# Patient Record
Sex: Male | Born: 1960 | Race: Black or African American | Hispanic: No | Marital: Married | Smoking: Never smoker
Health system: Southern US, Community
[De-identification: ages and names within clinical notes are randomized; demographics above are authoritative.]

## PROBLEM LIST (undated history)

## (undated) DIAGNOSIS — U071 COVID-19: Secondary | ICD-10-CM

---

## 2017-12-21 ENCOUNTER — Ambulatory Visit: Payer: Self-pay | Admitting: Family Medicine

## 2019-04-20 ENCOUNTER — Emergency Department (HOSPITAL_COMMUNITY): Payer: BLUE CROSS/BLUE SHIELD

## 2019-04-20 ENCOUNTER — Other Ambulatory Visit: Payer: Self-pay

## 2019-04-20 ENCOUNTER — Encounter (HOSPITAL_COMMUNITY): Payer: Self-pay | Admitting: Emergency Medicine

## 2019-04-20 ENCOUNTER — Emergency Department (HOSPITAL_COMMUNITY)
Admission: EM | Admit: 2019-04-20 | Discharge: 2019-04-20 | Disposition: A | Discharge status: Home / Self Care | Payer: BLUE CROSS/BLUE SHIELD | Attending: Emergency Medicine | Admitting: Emergency Medicine

## 2019-04-20 DIAGNOSIS — S060X0A Concussion without loss of consciousness, initial encounter: Secondary | ICD-10-CM

## 2019-04-20 DIAGNOSIS — Y999 Unspecified external cause status: Secondary | ICD-10-CM | POA: Insufficient documentation

## 2019-04-20 DIAGNOSIS — Y92410 Unspecified street and highway as the place of occurrence of the external cause: Secondary | ICD-10-CM | POA: Diagnosis not present

## 2019-04-20 DIAGNOSIS — R109 Unspecified abdominal pain: Secondary | ICD-10-CM | POA: Insufficient documentation

## 2019-04-20 DIAGNOSIS — Y9389 Activity, other specified: Secondary | ICD-10-CM | POA: Diagnosis not present

## 2019-04-20 DIAGNOSIS — R0789 Other chest pain: Secondary | ICD-10-CM | POA: Insufficient documentation

## 2019-04-20 DIAGNOSIS — R51 Headache: Secondary | ICD-10-CM | POA: Diagnosis present

## 2019-04-20 LAB — COMPREHENSIVE METABOLIC PANEL
ALT: 21 U/L (ref 0–44)
AST: 22 U/L (ref 15–41)
Albumin: 4.1 g/dL (ref 3.5–5.0)
Alkaline Phosphatase: 58 U/L (ref 38–126)
Anion gap: 7 (ref 5–15)
BUN: 8 mg/dL (ref 6–20)
CO2: 27 mmol/L (ref 22–32)
Calcium: 9 mg/dL (ref 8.9–10.3)
Chloride: 106 mmol/L (ref 98–111)
Creatinine, Ser: 1.04 mg/dL (ref 0.61–1.24)
GFR calc Af Amer: >60 mL/min (ref >60)
GFR calc non Af Amer: >60 mL/min (ref >60)
Glucose, Bld: 113 mg/dL — ABNORMAL HIGH (ref 70–99)
Potassium: 4.3 mmol/L (ref 3.5–5.1)
Sodium: 140 mmol/L (ref 135–145)
Total Bilirubin: 0.5 mg/dL (ref 0.3–1.2)
Total Protein: 6.5 g/dL (ref 6.5–8.1)

## 2019-04-20 LAB — CBC
HCT: 40.8 % (ref 39.0–52.0)
Hemoglobin: 13.9 g/dL (ref 13.0–17.0)
MCH: 30.7 pg (ref 26.0–34.0)
MCHC: 34.1 g/dL (ref 30.0–36.0)
MCV: 90.1 fL (ref 80.0–100.0)
Platelets: 203 10*3/uL (ref 150–400)
RBC: 4.53 MIL/uL (ref 4.22–5.81)
RDW: 11.9 % (ref 11.5–15.5)
WBC: 4 10*3/uL (ref 4.0–10.5)
nRBC: 0 % (ref 0.0–0.2)

## 2019-04-20 LAB — URINALYSIS, ROUTINE W REFLEX MICROSCOPIC
Bilirubin Urine: NEGATIVE
Glucose, UA: NEGATIVE mg/dL
Hgb urine dipstick: NEGATIVE
Ketones, ur: NEGATIVE mg/dL
Leukocytes,Ua: NEGATIVE
Nitrite: NEGATIVE
Protein, ur: NEGATIVE mg/dL
Specific Gravity, Urine: 1.012 (ref 1.005–1.030)
pH: 7 (ref 5.0–8.0)

## 2019-04-20 LAB — LIPASE, BLOOD: Lipase: 33 U/L (ref 11–51)

## 2019-04-20 MED ORDER — ONDANSETRON 4 MG PO TBDP: 4.0000 mg | ORAL_TABLET | Freq: Three times a day (TID) | ORAL | 0 refills | Status: DC | PRN

## 2019-04-20 MED ORDER — SODIUM CHLORIDE 0.9% FLUSH: 3.0000 mL | Freq: Once | INTRAVENOUS | Status: DC
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
## 2019-04-20 NOTE — ED Notes (Signed)
ED Provider at bedside. 
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
## 2019-04-20 NOTE — Discharge Instructions (Addendum)
Lab work chest x-rays and CT scans are normal  I suspect you have a mild concussion causing headache, nausea.  Concussion symptoms include include headache, nausea, sleep disturbance, dizziness, short term memory loss. Usually these symptom improve slowly over a period of 2-3 weeks, usually sooner. These symptoms wax and wane, and can be worsened by physical or cognitive exertion like reading, watching Tv, studying, using computer, playing video games. Take tylenol or ibuprofen for associated pain. Ice your scalp. We recommend physical and cognitive rest for the next 48-72 hours. This means avoid any exertional or cognitive activity that makes your symptoms worse including reading, playing video games, watching TV, exercising, jumping, playing sports.  The most important part of concussion management is avoiding a repeat head injury, do not perform any activities that will put you at risk for a second head injury. You must be cleared by a clinician before fully returning to sports. If your symptoms last longer than 2-3 week you need to further evaluation by primary care provider or concussion specialist. Return to the emergency department if you develop mental status change, lethargy, vomiting, vision changes  Your upper abdomen/chest pain is likely muscular.  Take acetaminophen or ibuprofen as needed for this.

## 2019-04-20 NOTE — ED Notes (Signed)
Patient transported to CT 

## 2020-01-06 ENCOUNTER — Other Ambulatory Visit: Payer: Self-pay

## 2020-01-06 ENCOUNTER — Encounter (HOSPITAL_COMMUNITY): Payer: Self-pay | Admitting: *Deleted

## 2020-01-06 ENCOUNTER — Ambulatory Visit (HOSPITAL_COMMUNITY)
Admission: EM | Admit: 2020-01-06 | Discharge: 2020-01-06 | Disposition: A | Discharge status: Home / Self Care | Payer: 59 | Attending: Internal Medicine | Admitting: Internal Medicine

## 2020-01-06 ENCOUNTER — Ambulatory Visit (INDEPENDENT_AMBULATORY_CARE_PROVIDER_SITE_OTHER): Payer: 59

## 2020-01-06 DIAGNOSIS — R0789 Other chest pain: Secondary | ICD-10-CM

## 2020-01-06 DIAGNOSIS — Z8616 Personal history of COVID-19: Secondary | ICD-10-CM | POA: Diagnosis not present

## 2020-01-06 HISTORY — DX: COVID-19: U07.1

## 2020-01-06 MED ORDER — ALBUTEROL SULFATE HFA 108 (90 BASE) MCG/ACT IN AERS: 1.0000 | INHALATION_SPRAY | Freq: Four times a day (QID) | RESPIRATORY_TRACT | 0 refills | Status: DC | PRN

## 2020-01-06 MED ORDER — PREDNISONE 50 MG PO TABS: 50.0000 mg | ORAL_TABLET | Freq: Every day | ORAL | 0 refills | Status: AC

## 2020-01-06 NOTE — ED Triage Notes (Addendum)
Reports having + Covid test 1 wk ago.  States no longer having fevers, but over past 3 days c/o left  chest pain, worse with deep breathing.  Reports minimal cough.  Denies any SOB.  States today "I'm feeling a lot better" than previous days.

## 2020-01-06 NOTE — Discharge Instructions (Addendum)
Chest Xray normal  Begin prednisone daily for the next 5 days, take with Use albuterol inhaler as needed for shortness of breath, wheezing  If chest pain returns, worsens, increased back pain, dizziness or lightheadedness please follow-up in the emergency room

## 2020-01-06 NOTE — ED Provider Notes (Addendum)
[facility]    CSN: [id number] Arrival date & time: 01/06/20  1441      History   Chief Complaint Chief Complaint  Patient presents with  . Chest Pain  . Covid Positive    HPI Luis Parrish is a 59 y.o. male recent positive Covid, presenting today for evaluation of chest pain.  Patient states that over the past 2 to 3 days he has had discomfort in the left side of his chest as well as his left back.  Pain is pleuritic and worse with inspiration.  Pain has actually improved today, but is still present.  His cough and fevers have improved and has not had fever for the past few days.  He denies any shortness of breath, main concern is pain.  Denies history of hypertension, diabetes, denies tobacco use.  Denies previous DVT/PE.  Denies leg pain or swelling. Denies changes with leaning forward.   HPI  Past Medical History:  Diagnosis Date  . COVID-19     There are no problems to display for this patient.   History reviewed. No pertinent surgical history.     Home Medications    Prior to Admission medications   Medication Sig Start Date End Date Taking? Authorizing Provider  Acetaminophen (TYLENOL PO) Take by mouth.   Yes [provider]  Ascorbic Acid (VITAMIN C PO) Take by mouth.   Yes [provider]  Multiple Vitamins-Minerals (ZINC PO) Take by mouth.   Yes [provider]  VITAMIN D PO Take by mouth.   Yes [provider]  albuterol (VENTOLIN HFA) 108 (90 Base) MCG/ACT inhaler Inhale 1-2 puffs into the lungs every 6 (six) hours as needed for wheezing or shortness of breath. 01/06/20   Verdella Laidlaw C, PA-C  ondansetron (ZOFRAN ODT) 4 MG disintegrating tablet Take 1 tablet (4 mg total) by mouth every 8 (eight) hours as needed for nausea or vomiting. 04/20/19   Liberty Handy, PA-C  predniSONE (DELTASONE) 50 MG tablet Take 1 tablet (50 mg total) by mouth daily for 5 days. 01/06/20 01/11/20  Steen Bisig, Junius Creamer, PA-C    Family  History Family History  Problem Relation Age of Onset  . Osteoarthritis Mother   . Healthy Mother   . Healthy Father   . Osteoarthritis Father     Social History Social History   Tobacco Use  . Smoking status: Never Smoker  . Smokeless tobacco: Never Used  Substance Use Topics  . Alcohol use: Never  . Drug use: Never     Allergies   Patient has no known allergies.   Review of Systems Review of Systems  Constitutional: Negative for activity change, appetite change, chills, fatigue and fever.  HENT: Negative for congestion, ear pain, rhinorrhea, sinus pressure, sore throat and trouble swallowing.   Eyes: Negative for discharge and redness.  Respiratory: Positive for cough. Negative for chest tightness and shortness of breath.   Cardiovascular: Positive for chest pain. Negative for leg swelling.  Gastrointestinal: Negative for abdominal pain, diarrhea, nausea and vomiting.  Musculoskeletal: Negative for myalgias.  Skin: Negative for rash.  Neurological: Negative for dizziness, light-headedness and headaches.     Physical Exam Triage Vital Signs ED Triage Vitals  Enc Vitals Group     BP 01/06/20 1540 133/78     Pulse Rate 01/06/20 1540 74     Resp 01/06/20 1540 16     Temp 01/06/20 1540 98.9 F (37.2 C)     Temp Source 01/06/20  1540 Oral     SpO2 01/06/20 1540 100 %     Weight --      Height --      Head Circumference --      Peak Flow --      Pain Score 01/06/20 1541 2     Pain Loc --      Pain Edu? --      Excl. in GC? --    No data found.  Updated Vital Signs BP 133/78   Pulse 74   Temp 98.9 F (37.2 C) (Oral)   Resp 16   SpO2 100%   Visual Acuity Right Eye Distance:   Left Eye Distance:   Bilateral Distance:    Right Eye Near:   Left Eye Near:    Bilateral Near:     Physical Exam Vitals and nursing note reviewed.  Constitutional:      Appearance: He is well-developed.  HENT:     Head: Normocephalic and atraumatic.     Ears:      Comments: Bilateral ears without tenderness to palpation of external auricle, tragus and mastoid, EAC's without erythema or swelling, TM's with good bony landmarks and cone of light. Non erythematous. Eyes:     Conjunctiva/sclera: Conjunctivae normal.  Cardiovascular:     Rate and Rhythm: Normal rate and regular rhythm.     Heart sounds: No murmur.     Comments: No friction rub auscultated  Pulmonary:     Effort: Pulmonary effort is normal. No respiratory distress.     Comments: Faint expiratory wheezing noted to bilateral lung bases, no rales Abdominal:     Palpations: Abdomen is soft.     Tenderness: There is no abdominal tenderness.  Musculoskeletal:     Cervical back: Neck supple.     Comments: Tenderness to palpation of left sternal border with deep palpation, left periscapular area with tenderness to palpation  Skin:    General: Skin is warm and dry.  Neurological:     Mental Status: He is alert.      UC Treatments / Results  Labs (all labs ordered are listed, but only abnormal results are displayed) Labs Reviewed - No data to display  EKG   Radiology DG Chest 2 View  Result Date: 01/06/2020 CLINICAL DATA:  Left-sided chest pain. EXAM: CHEST - 2 VIEW COMPARISON:  Apr 20, 2019 FINDINGS: The heart size and mediastinal contours are within normal limits. Both lungs are clear. The visualized skeletal structures are unremarkable. IMPRESSION: No active cardiopulmonary disease. Electronically Signed   By: Aram Candela M.D.   On: 01/06/2020 16:25    Procedures Procedures (including critical care time)  Medications Ordered in UC Medications - No data to display  Initial Impression / Assessment and Plan / UC Course  I have reviewed the triage vital signs and the nursing notes.  Pertinent labs & imaging results that were available during my care of the patient were reviewed by me and considered in my medical decision making (see chart for details).     EKG normal  sinus rhythm, does have some mild nonspecific ST elevation of V2, V3 and V4, upsloping.  No diffuse elevation, and no friction rub auscultated.  Seems less likely ACS.  Chest x-ray negative.  Discomfort mildly reproducible with palpation to chest and back as well as with some wheezing.  Symptoms improving today, exam more consistent with chest wall pain versus pericarditis.  Given recent Covid diagnosis advised patient if his pain is  returning/worsening, developing shortness of breath to follow-up in the emergency room for further evaluation and rule out underlying cardiac etiology.  Will treat with prednisone and albuterol at this time with continued monitoring.  Discussed strict return precautions. Patient verbalized understanding and is agreeable with plan.  Final Clinical Impressions(s) / UC Diagnoses   Final diagnoses:  Chest wall pain     Discharge Instructions     Chest Xray normal  Begin prednisone daily for the next 5 days, take with Use albuterol inhaler as needed for shortness of breath, wheezing  If chest pain returns, worsens, increased back pain, dizziness or lightheadedness please follow-up in the emergency room    ED Prescriptions    Medication Sig Dispense Auth. Provider   predniSONE (DELTASONE) 50 MG tablet Take 1 tablet (50 mg total) by mouth daily for 5 days. 5 tablet Renesmee Raine C, PA-C   albuterol (VENTOLIN HFA) 108 (90 Base) MCG/ACT inhaler Inhale 1-2 puffs into the lungs every 6 (six) hours as needed for wheezing or shortness of breath. 8 g Tatyana Biber, [place] C, PA-C     PDMP not reviewed this encounter.   Janith Lima, PA-C 01/06/20 1934    Shelton Silvas 01/06/20 1936

## 2020-04-27 ENCOUNTER — Ambulatory Visit: Payer: 59
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
## 2021-03-24 IMAGING — DX DG CHEST 2V
2 series · 2 of 2 positions shown · non-contrast
Comparison: April 20, 2019

CLINICAL DATA: Left-sided chest pain.

EXAM:
CHEST - 2 VIEW

[chest pa]
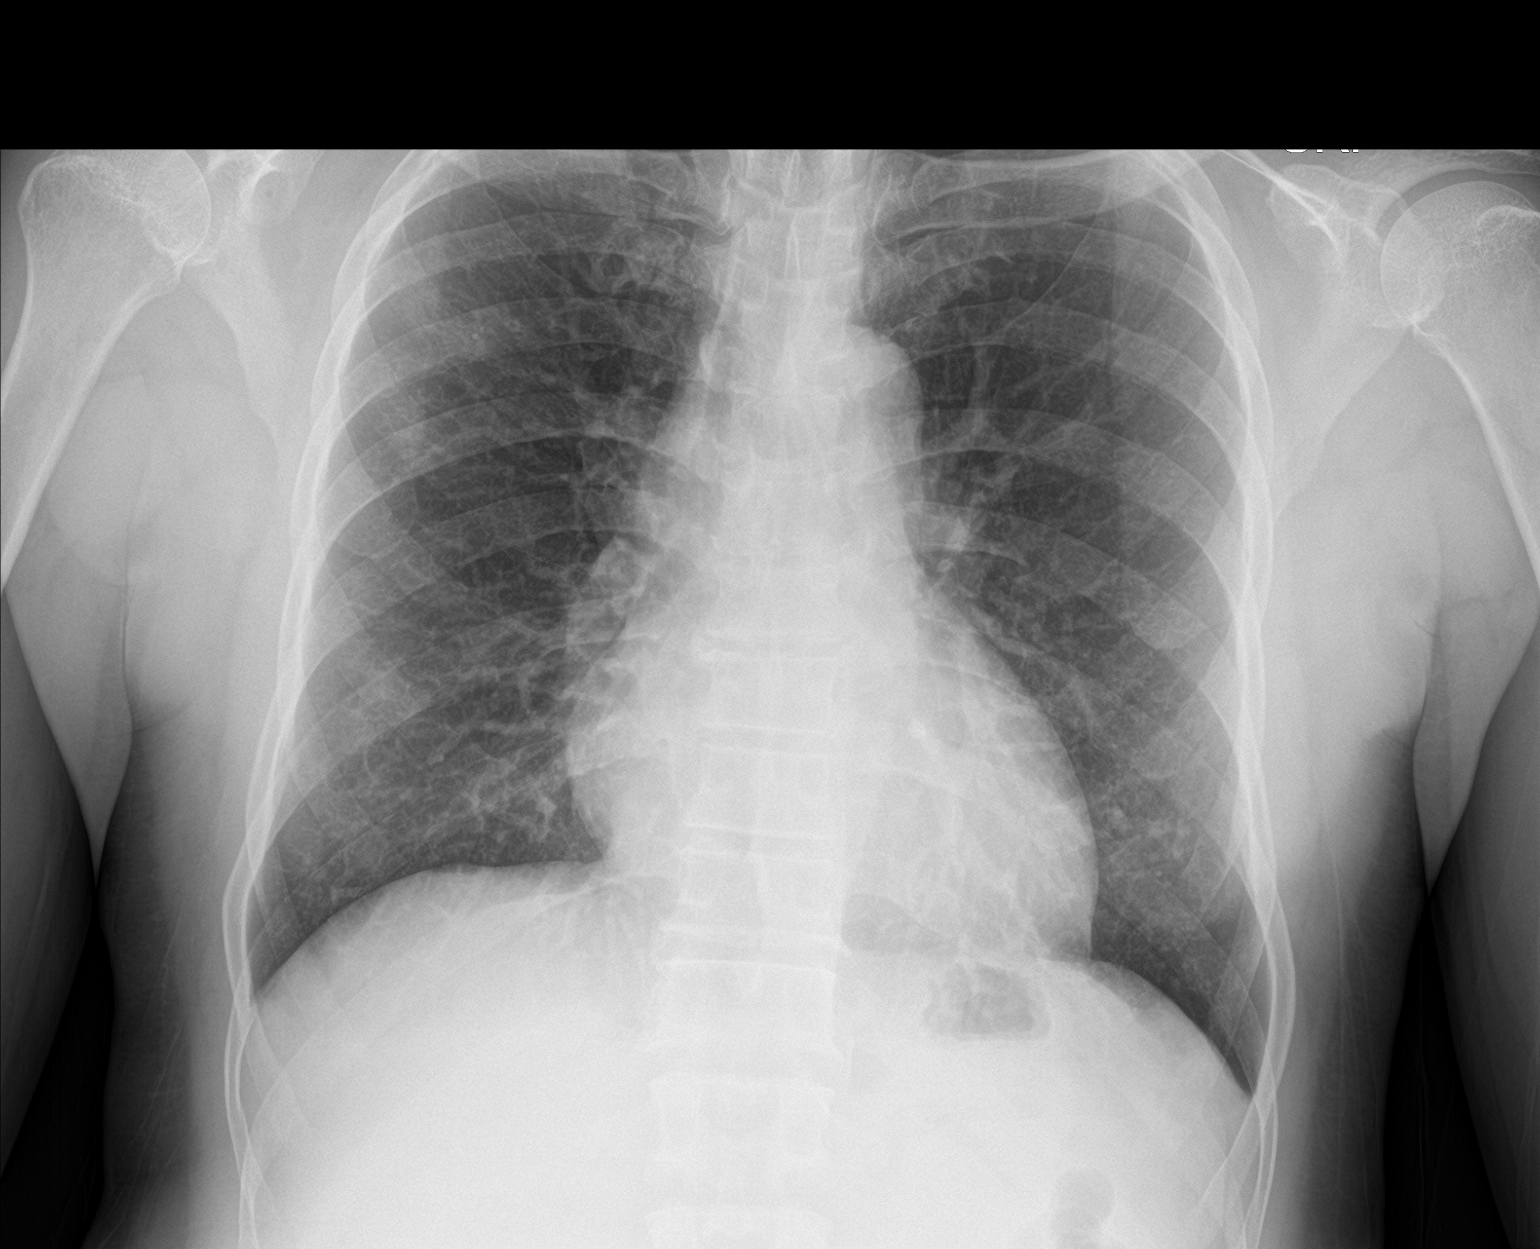

[chest lat]
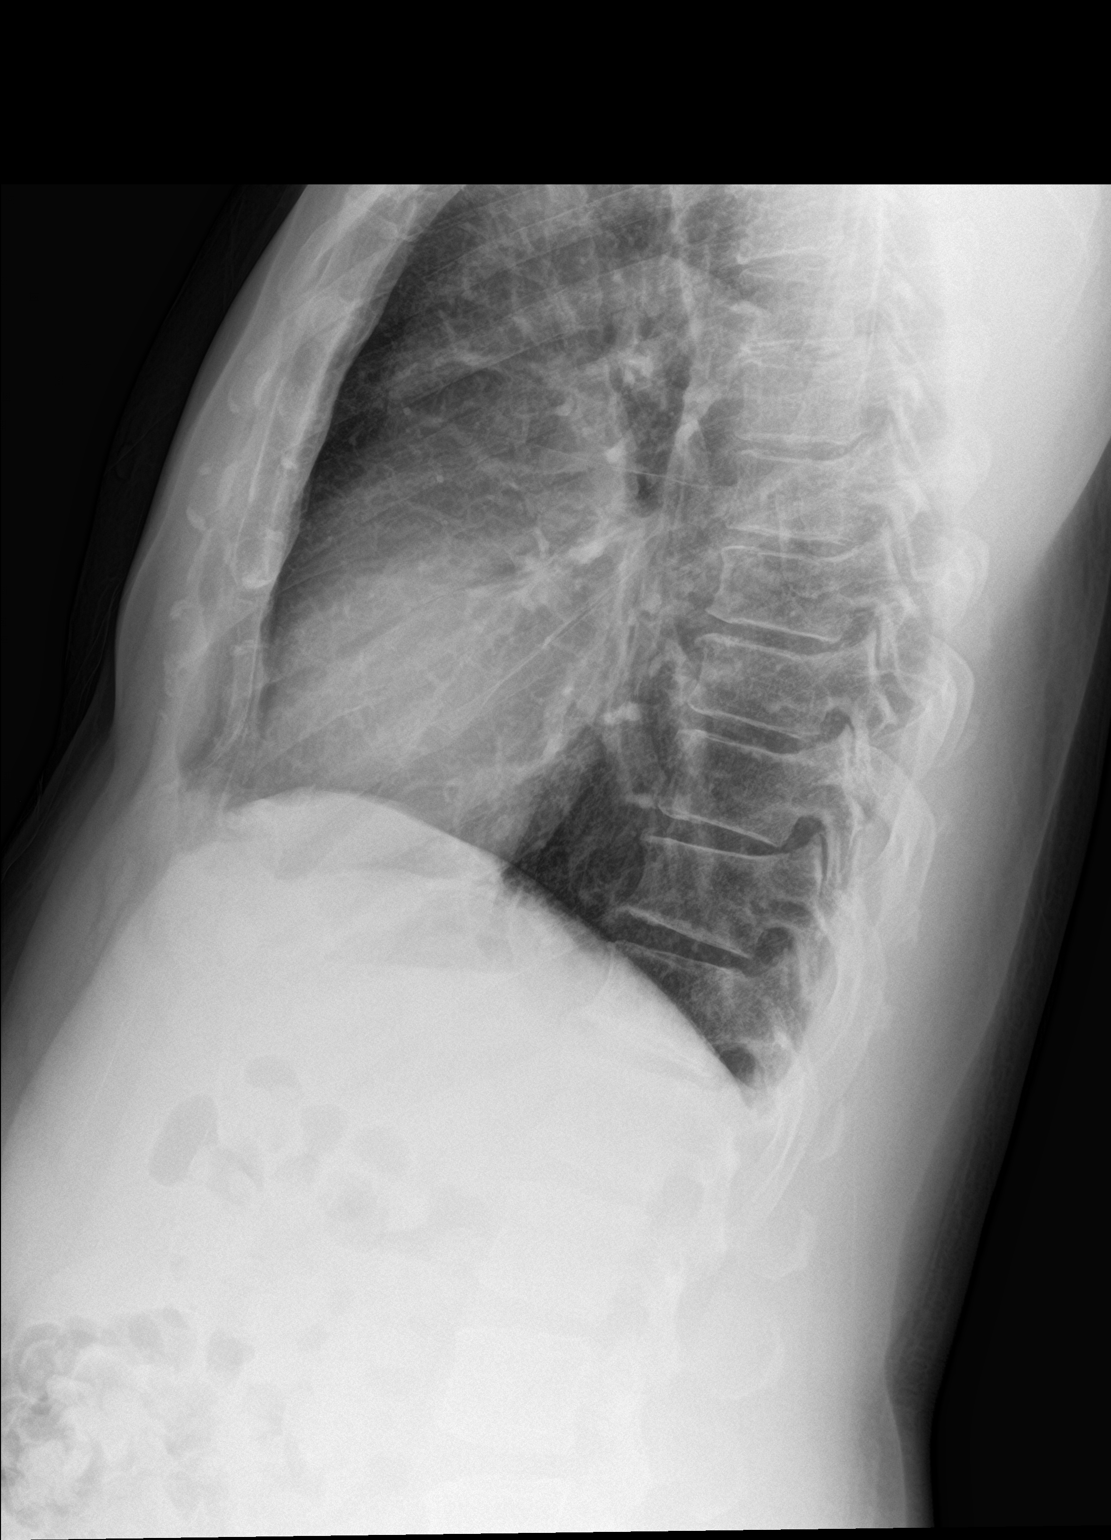

[2 of 2 positions shown; findings below may reference images not displayed]

FINDINGS: The heart size and mediastinal contours are within normal limits.
Both lungs are clear. The visualized skeletal structures are
unremarkable.
IMPRESSION: No active cardiopulmonary disease.

## 2024-12-09 ENCOUNTER — Ambulatory Visit (HOSPITAL_COMMUNITY)
Admission: EM | Admit: 2024-12-09 | Discharge: 2024-12-09 | Disposition: A | Discharge status: Home / Self Care | Attending: Emergency Medicine | Admitting: Emergency Medicine

## 2024-12-09 ENCOUNTER — Encounter (HOSPITAL_COMMUNITY): Payer: Self-pay

## 2024-12-09 DIAGNOSIS — S39012A Strain of muscle, fascia and tendon of lower back, initial encounter: Secondary | ICD-10-CM | POA: Diagnosis not present

## 2024-12-09 MED ORDER — DEXAMETHASONE SOD PHOSPHATE PF 10 MG/ML IJ SOLN
10.0000 mg | Freq: Once | INTRAMUSCULAR | Status: AC
  Administered 2024-12-09: 10 mg via INTRAMUSCULAR

## 2024-12-09 MED ORDER — LIDOCAINE 5 % EX PTCH: 1.0000 | MEDICATED_PATCH | CUTANEOUS | 0 refills | Status: AC

## 2024-12-09 NOTE — Discharge Instructions (Signed)
 You were given an injection of Decadron  in clinic today which is a steroid to help with inflammation related to your pain. I have prescribed lidocaine  patches that you can apply for 12 hours at a time once daily for additional pain relief. Continue ibuprofen and alternating this with 500 to 1000 mg of Tylenol every 6-8 hours as needed for pain. You can also alternate between ice and heat and do some gentle stretching to help with pain. Follow-up with [facility] if your pain continues for further evaluation. Follow-up with your primary care provider or return here as needed.

## 2024-12-09 NOTE — ED Provider Notes (Signed)
 " [facility]    CSN: [id number] Arrival date & time: 12/09/24  9188      History   Chief Complaint No chief complaint on file.   HPI Luis Parrish is a 63 y.o. male.   Patient presents with bilateral low back pain after attempting to lift something heavy on 12/21.  Patient states that when he bent over to pick something up he felt something pull in his lower back.  Patient denies any recent falls or car accidents.  Patient reports that he has been taking ibuprofen with some relief of his pain but the pain will return.  Patient denies any numbness, tingling, weakness, or pain that radiates down his legs.  Denies saddle anesthesia or bowel/bladder incontinence.  The history is provided by the patient and medical records.    Past Medical History:  Diagnosis Date   COVID-19     There are no active problems to display for this patient.   History reviewed. No pertinent surgical history.     Home Medications    Prior to Admission medications  Medication Sig Start Date End Date Taking? Authorizing Provider  lidocaine  (LIDODERM ) 5 % Place 1 patch onto the skin daily. Remove & Discard patch within 12 hours or as directed by MD 12/09/24  Yes Johnie Rumaldo LABOR, NP    Family History Family History  Problem Relation Age of Onset   Osteoarthritis Mother    Healthy Mother    Healthy Father    Osteoarthritis Father     Social History Social History[1]   Allergies   Patient has no known allergies.   Review of Systems Review of Systems  Per HPI  Physical Exam Triage Vital Signs ED Triage Vitals  Encounter Vitals Group     BP 12/09/24 0907 (!) 151/86     Girls Systolic BP Percentile --      Girls Diastolic BP Percentile --      Boys Systolic BP Percentile --      Boys Diastolic BP Percentile --      Pulse Rate 12/09/24 0907 62     Resp 12/09/24 0907 18     Temp 12/09/24 0907 98.2 F (36.8 C)     Temp Source 12/09/24 0907 Oral     SpO2 12/09/24  0907 98 %     Weight --      Height --      Head Circumference --      Peak Flow --      Pain Score 12/09/24 0909 7     Pain Loc --      Pain Education --      Exclude from Growth Chart --    No data found.  Updated Vital Signs BP (!) 151/86 (BP Location: Left Arm)   Pulse 62   Temp 98.2 F (36.8 C) (Oral)   Resp 18   SpO2 98%   Visual Acuity Right Eye Distance:   Left Eye Distance:   Bilateral Distance:    Right Eye Near:   Left Eye Near:    Bilateral Near:     Physical Exam Vitals and nursing note reviewed.  Constitutional:      General: He is not in acute distress.    Appearance: Normal appearance. He is well-developed and well-groomed. He is not ill-appearing.  Musculoskeletal:     Cervical back: Normal.     Thoracic back: Normal.     Lumbar back: Tenderness present. No swelling, edema, deformity, signs of  trauma or bony tenderness. Normal range of motion. Negative right straight leg raise test and negative left straight leg raise test.       Back:     Comments: Tenderness noted to generalized low back without spinous process tenderness.  Skin:    General: Skin is warm and dry.  Neurological:     Mental Status: He is alert.  Psychiatric:        Behavior: Behavior is cooperative.      UC Treatments / Results  Labs (all labs ordered are listed, but only abnormal results are displayed) Labs Reviewed - No data to display  EKG   Radiology No results found.  Procedures Procedures (including critical care time)  Medications Ordered in UC Medications  dexamethasone  (DECADRON ) injection 10 mg (has no administration in time range)    Initial Impression / Assessment and Plan / UC Course  I have reviewed the triage vital signs and the nursing notes.  Pertinent labs & imaging results that were available during my care of the patient were reviewed by me and considered in my medical decision making (see chart for details).     Patient is overall  well-appearing.  Vitals are stable.  Pain likely muscular in nature.  Given IM Decadron  in clinic to help with inflammation related to pain.  Prescribed lidocaine  patches for additional pain relief.  Recommended continuing with ibuprofen and alternating this with Tylenol as needed.  Given orthopedic follow-up if needed.  Discussed follow-up and return precautions. Final Clinical Impressions(s) / UC Diagnoses   Final diagnoses:  Strain of lumbar region, initial encounter     Discharge Instructions      You were given an injection of Decadron  in clinic today which is a steroid to help with inflammation related to your pain. I have prescribed lidocaine  patches that you can apply for 12 hours at a time once daily for additional pain relief. Continue ibuprofen and alternating this with 500 to 1000 mg of Tylenol every 6-8 hours as needed for pain. You can also alternate between ice and heat and do some gentle stretching to help with pain. Follow-up with [facility] if your pain continues for further evaluation. Follow-up with your primary care provider or return here as needed.   ED Prescriptions     Medication Sig Dispense Auth. Provider   lidocaine  (LIDODERM ) 5 % Place 1 patch onto the skin daily. Remove & Discard patch within 12 hours or as directed by MD 30 patch Johnie Rumaldo LABOR, NP      PDMP not reviewed this encounter.    [1]  Social History Tobacco Use   Smoking status: Never   Smokeless tobacco: Never  Vaping Use   Vaping status: Never Used  Substance Use Topics   Alcohol use: Never   Drug use: Never     Johnie Rumaldo LABOR, NP 12/09/24 0957  "

## 2024-12-09 NOTE — ED Triage Notes (Signed)
 Patient presents to the office with lower back pain after lifting heavy equipment last week. Patient has not taking any medication to help with symptoms.

## 2024-12-09 NOTE — ED Notes (Signed)
Patient did not answer in lobby
